# Patient Record
Sex: Male | Born: 1980 | Race: Black or African American | Hispanic: No | Smoking: Former smoker
Health system: Southern US, Community
[De-identification: ages and names within clinical notes are randomized; demographics above are authoritative.]

## PROBLEM LIST (undated history)

## (undated) DIAGNOSIS — D573 Sickle-cell trait: Secondary | ICD-10-CM

---
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
## 2000-01-15 ENCOUNTER — Emergency Department (HOSPITAL_COMMUNITY): Admission: EM | Admit: 2000-01-15 | Discharge: 2000-01-15 | Payer: Self-pay | Admitting: Emergency Medicine

## 2000-04-03 ENCOUNTER — Emergency Department (HOSPITAL_COMMUNITY): Admission: EM | Admit: 2000-04-03 | Discharge: 2000-04-03 | Payer: Self-pay

## 2000-04-03 ENCOUNTER — Encounter: Payer: Self-pay | Admitting: Emergency Medicine

## 2000-06-07 ENCOUNTER — Emergency Department (HOSPITAL_COMMUNITY): Admission: EM | Admit: 2000-06-07 | Discharge: 2000-06-07 | Payer: Self-pay | Admitting: Emergency Medicine

## 2000-06-07 ENCOUNTER — Encounter: Payer: Self-pay | Admitting: Emergency Medicine

## 2000-06-16 ENCOUNTER — Encounter: Payer: Self-pay | Admitting: Emergency Medicine

## 2000-06-16 ENCOUNTER — Emergency Department (HOSPITAL_COMMUNITY): Admission: EM | Admit: 2000-06-16 | Discharge: 2000-06-16 | Payer: Self-pay | Admitting: Emergency Medicine

## 2000-11-09 ENCOUNTER — Emergency Department (HOSPITAL_COMMUNITY): Admission: EM | Admit: 2000-11-09 | Discharge: 2000-11-09 | Payer: Self-pay | Admitting: *Deleted

## 2000-11-10 ENCOUNTER — Emergency Department (HOSPITAL_COMMUNITY): Admission: EM | Admit: 2000-11-10 | Discharge: 2000-11-10 | Payer: Self-pay

## 2000-11-12 ENCOUNTER — Emergency Department (HOSPITAL_COMMUNITY): Admission: EM | Admit: 2000-11-12 | Discharge: 2000-11-12 | Payer: Self-pay | Admitting: Emergency Medicine

## 2001-02-25 ENCOUNTER — Emergency Department (HOSPITAL_COMMUNITY): Admission: EM | Admit: 2001-02-25 | Discharge: 2001-02-25 | Payer: Self-pay | Admitting: Emergency Medicine

## 2001-02-25 ENCOUNTER — Encounter: Payer: Self-pay | Admitting: Emergency Medicine

## 2001-06-14 ENCOUNTER — Encounter: Payer: Self-pay | Admitting: Emergency Medicine

## 2001-06-15 ENCOUNTER — Observation Stay (HOSPITAL_COMMUNITY): Admission: EM | Admit: 2001-06-15 | Discharge: 2001-06-15 | Payer: Self-pay | Admitting: Emergency Medicine

## 2001-06-24 ENCOUNTER — Encounter: Admission: RE | Admit: 2001-06-24 | Discharge: 2001-06-24 | Payer: Self-pay | Admitting: Family Medicine

## 2001-07-14 ENCOUNTER — Emergency Department (HOSPITAL_COMMUNITY): Admission: EM | Admit: 2001-07-14 | Discharge: 2001-07-14 | Payer: Self-pay | Admitting: Emergency Medicine

## 2003-02-12 ENCOUNTER — Emergency Department (HOSPITAL_COMMUNITY): Admission: EM | Admit: 2003-02-12 | Discharge: 2003-02-13 | Payer: Self-pay | Admitting: Emergency Medicine

## 2003-11-10 ENCOUNTER — Emergency Department (HOSPITAL_COMMUNITY): Admission: EM | Admit: 2003-11-10 | Discharge: 2003-11-10 | Payer: Self-pay | Admitting: Emergency Medicine

## 2003-11-13 ENCOUNTER — Emergency Department (HOSPITAL_COMMUNITY): Admission: EM | Admit: 2003-11-13 | Discharge: 2003-11-14 | Payer: Self-pay | Admitting: Emergency Medicine

## 2004-12-03 ENCOUNTER — Emergency Department (HOSPITAL_COMMUNITY): Admission: EM | Admit: 2004-12-03 | Discharge: 2004-12-03 | Payer: Self-pay | Admitting: Emergency Medicine

## 2005-04-10 ENCOUNTER — Emergency Department (HOSPITAL_COMMUNITY): Admission: EM | Admit: 2005-04-10 | Discharge: 2005-04-11 | Payer: Self-pay | Admitting: Emergency Medicine

## 2005-08-16 ENCOUNTER — Emergency Department (HOSPITAL_COMMUNITY): Admission: EM | Admit: 2005-08-16 | Discharge: 2005-08-16 | Payer: Self-pay | Admitting: Emergency Medicine

## 2005-09-29 ENCOUNTER — Emergency Department (HOSPITAL_COMMUNITY): Admission: EM | Admit: 2005-09-29 | Discharge: 2005-09-29 | Payer: Self-pay | Admitting: Emergency Medicine

## 2006-03-09 ENCOUNTER — Emergency Department (HOSPITAL_COMMUNITY): Admission: EM | Admit: 2006-03-09 | Discharge: 2006-03-09 | Payer: Self-pay | Admitting: Emergency Medicine

## 2006-03-17 ENCOUNTER — Emergency Department (HOSPITAL_COMMUNITY): Admission: EM | Admit: 2006-03-17 | Discharge: 2006-03-17 | Payer: Self-pay | Admitting: Emergency Medicine

## 2007-11-16 IMAGING — CR DG FOOT COMPLETE 3+V*R*
3 series · 3 of 3 positions shown · non-contrast
Comparison: none

CLINICAL DATA: Right foot injury with pain.
 RIGHT FOOT ?3  VIEW:
 There is no evidence of fracture or dislocation.  There is no evidence of arthropathy or other focal bone abnormality.  Soft tissues are unremarkable.

[view not recorded (1 of 3)]
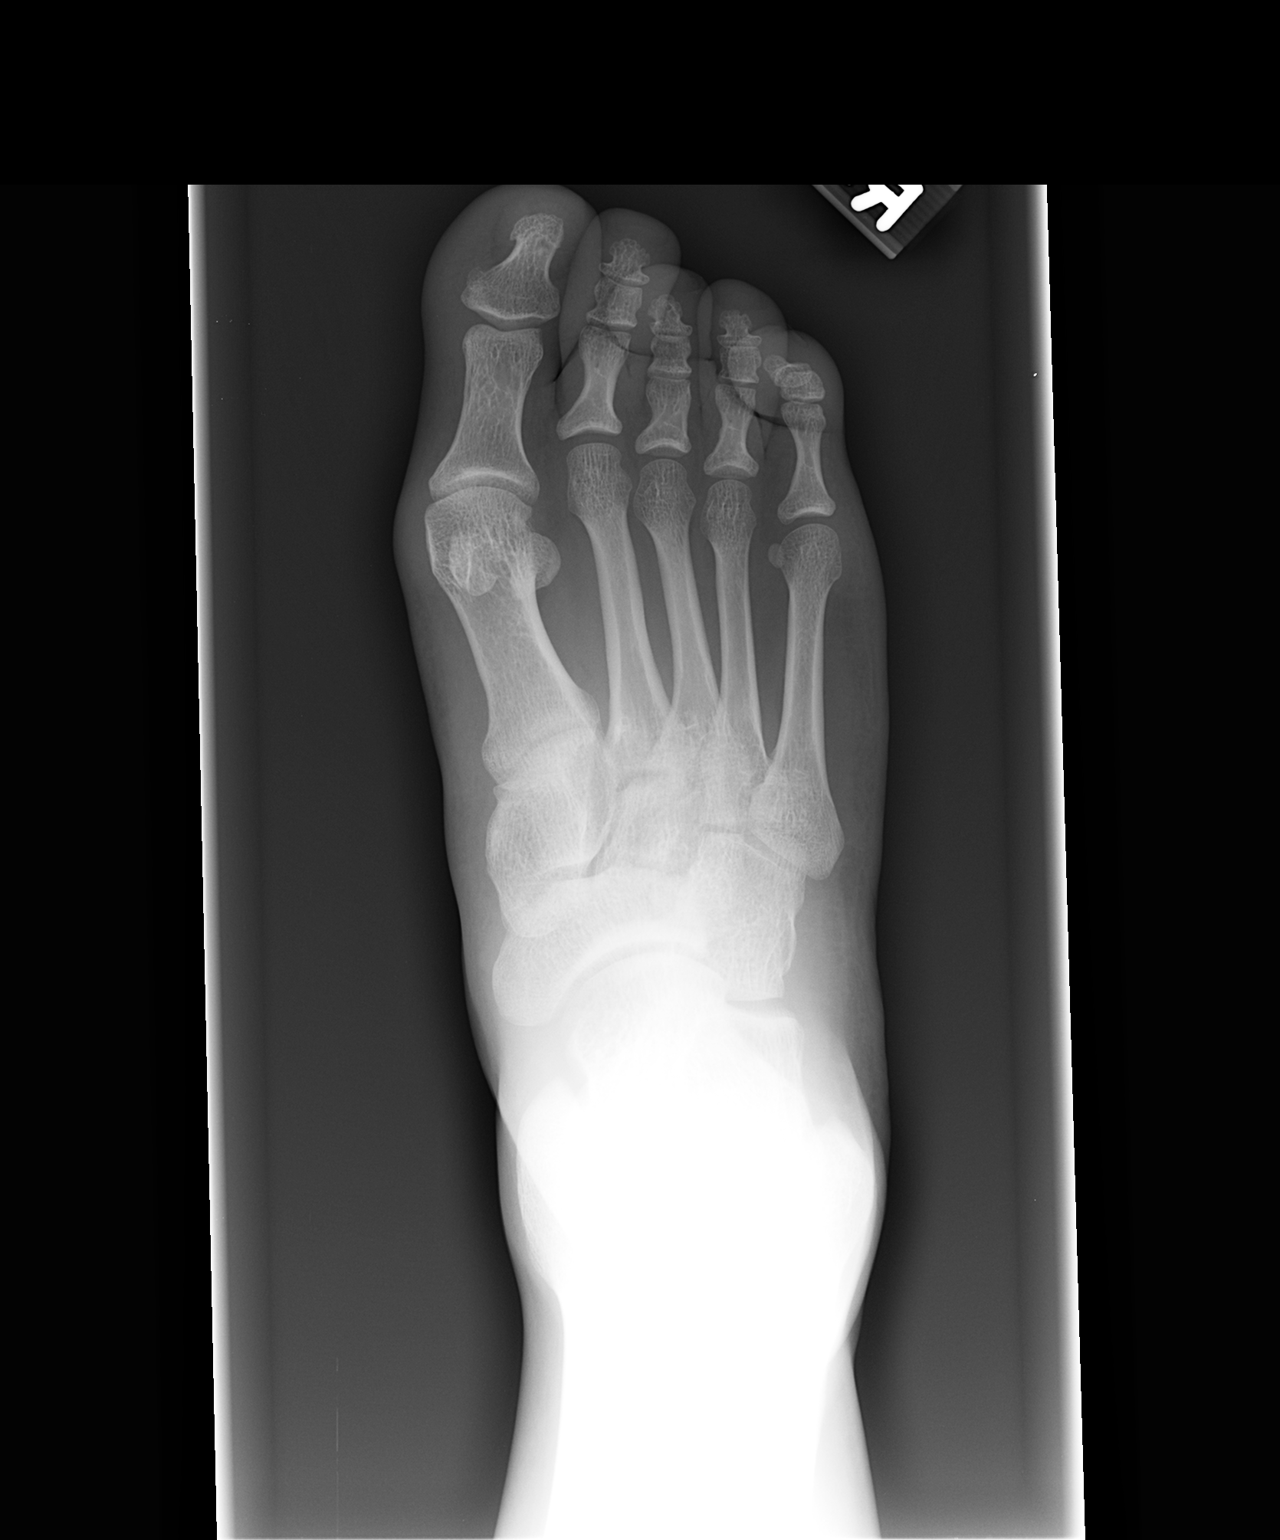

[view not recorded (2 of 3)]
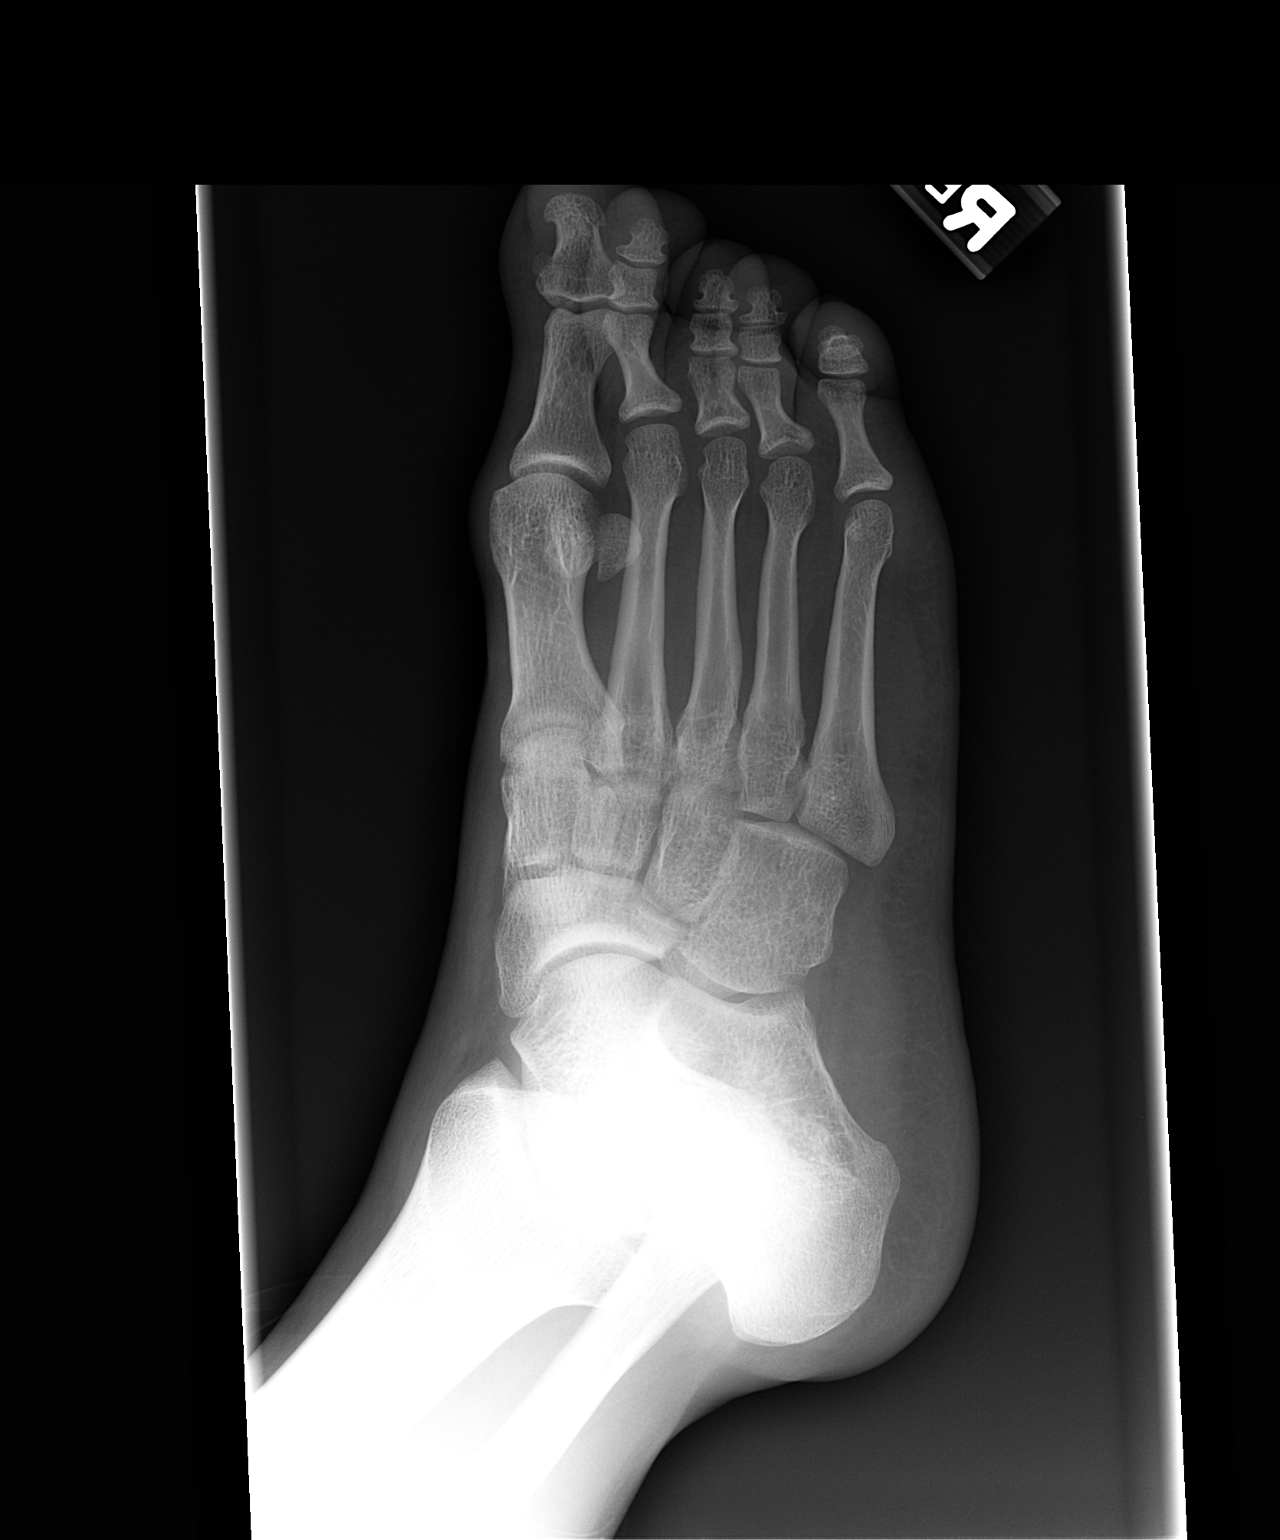

[view not recorded (3 of 3)]
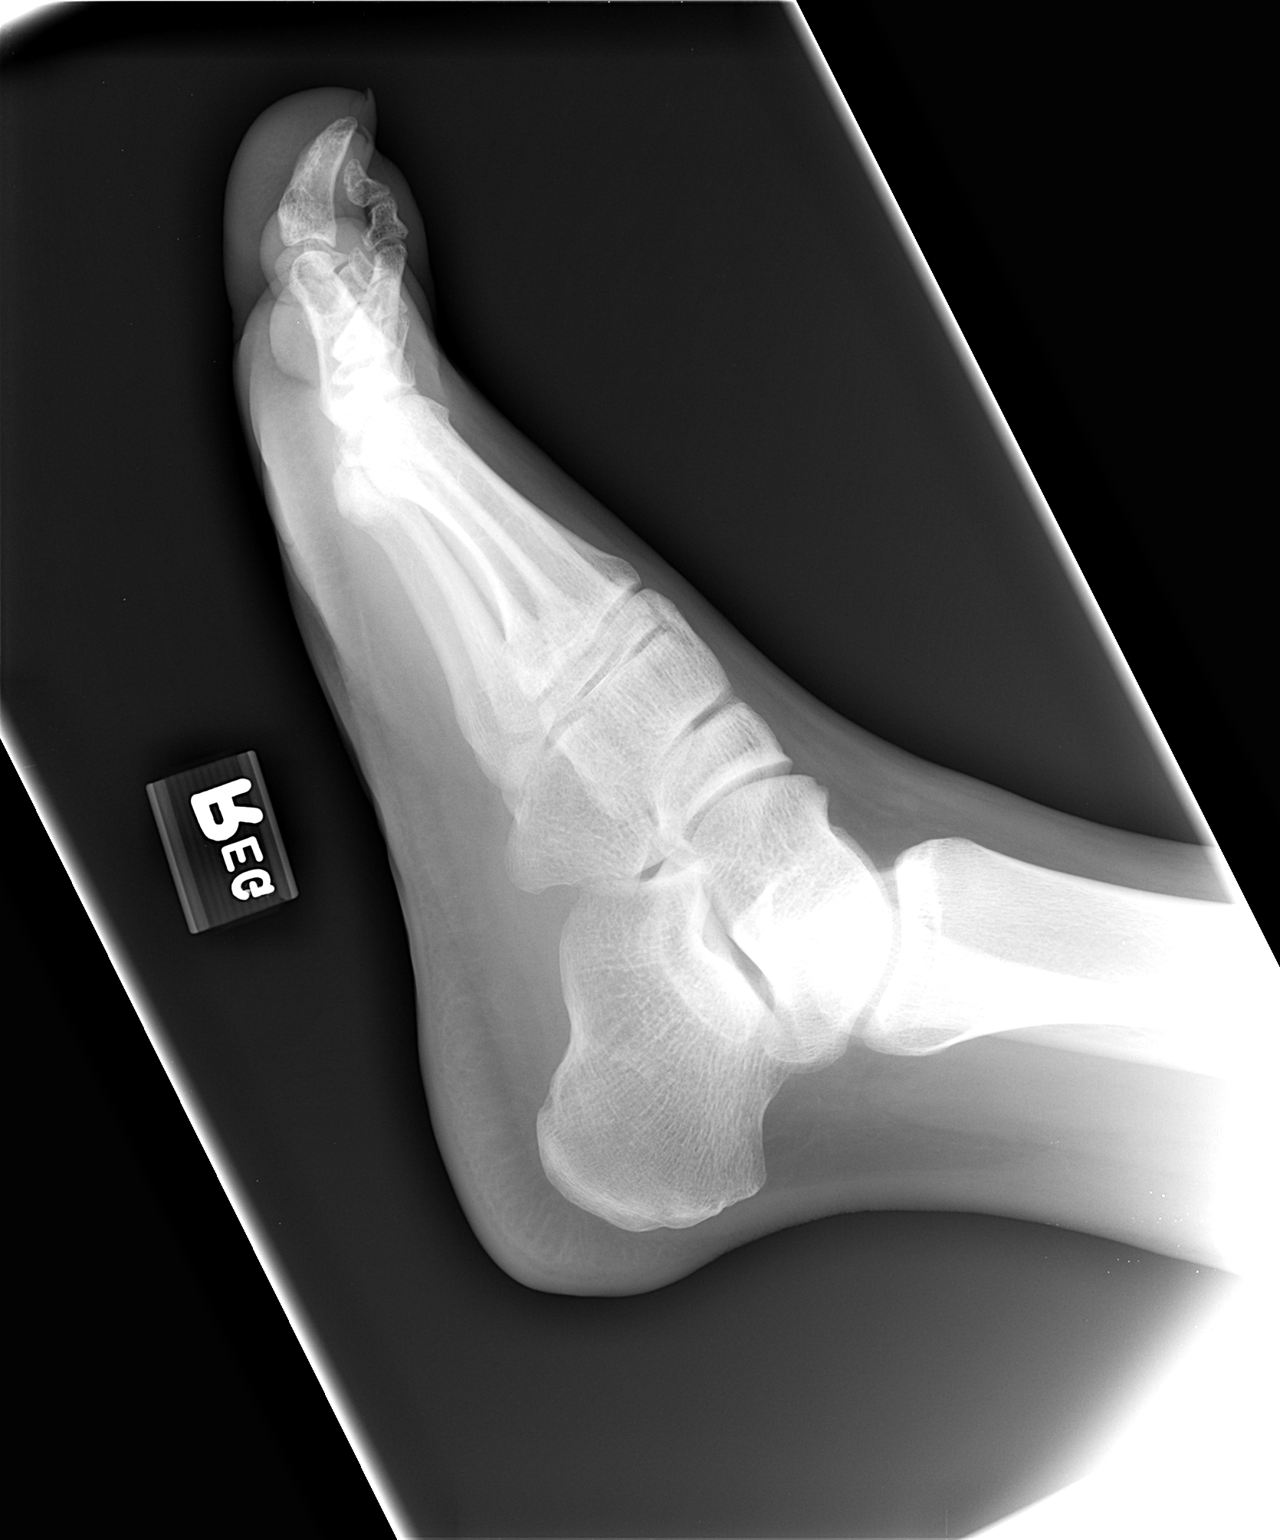

[3 of 3 positions shown; findings below may reference images not displayed]

IMPRESSION: Negative.

## 2007-12-29 ENCOUNTER — Emergency Department (HOSPITAL_COMMUNITY): Admission: EM | Admit: 2007-12-29 | Discharge: 2007-12-29 | Payer: Self-pay | Admitting: Emergency Medicine

## 2008-11-07 ENCOUNTER — Emergency Department (HOSPITAL_COMMUNITY): Admission: EM | Admit: 2008-11-07 | Discharge: 2008-11-07 | Payer: Self-pay | Admitting: Emergency Medicine

## 2009-06-27 ENCOUNTER — Emergency Department (HOSPITAL_COMMUNITY): Admission: EM | Admit: 2009-06-27 | Discharge: 2009-06-27 | Payer: Self-pay | Admitting: Emergency Medicine

## 2009-07-14 ENCOUNTER — Emergency Department (HOSPITAL_COMMUNITY): Admission: EM | Admit: 2009-07-14 | Discharge: 2009-07-14 | Payer: Self-pay | Admitting: Emergency Medicine

## 2009-11-24 ENCOUNTER — Emergency Department (HOSPITAL_COMMUNITY): Admission: EM | Admit: 2009-11-24 | Discharge: 2009-11-24 | Payer: Self-pay | Admitting: Emergency Medicine

## 2010-05-26 NOTE — Discharge Summary (Signed)
[place]  Patient:    FREMONT, SKALICKY Visit Number: [id number] MRN: [id number]          Service Type: MED Location: 3000 [id number] [id number] Attending Physician:  Willow Ora Dictated by:   Arlis Porta, M.D. Admit Date:  06/14/2001 Discharge Date: 06/15/2001                             Discharge Summary  DISCHARGE DIAGNOSES:  Infectious colitis.  LABORATORY DATA: A CMET at discharge showed a sodium of 130, potassium 3.3, chloride 101, c2 28, BUN 10, creatinine 1.1, glucose 120, calcium 8.0. AST 23, ALT 18, alkaline phosphatase 53, total bilirubin 1.0, total protein 5.9, albumin 2.8. A CBC showed a white blood cell count of 5.6, hemoglobin 13.4, hematocrit 39.5, platelets 119, 77% neutrophils, 10% lymphocytes, 10% monocytes, 3% eosinophils with an absolute neutrophil count of 4.3. The ESR was 2. Lipase 16, amylase 52.  PROCEDURES PERFORMED:  A CT scan of the belly showed bowel wall thickening from the splenic flexure to the rectum consistent with infectious colitis versus ulcerative colitis.  HOSPITAL COURSE:  Infectious colitis. The patient is a 30 year old previously healthy African-American male. He was admitted with a two-day history of vomiting and diarrhea. There was some blood associated with his stools. A CT scan findings were above. He was admitted for IV fluid hydration. That same day the patient had improving abdominal pain with decrease of his nausea and vomiting. He was able to tolerate p.o. He remained afebrile with normal and stable vital signs. Irritable bowel disease is less likely given the lack of family history, as well as the lack of previous episodes in the past. The patients ESR of 2 was also reassuring that this was not inflammatory in nature. He will be discharged to home with close followup at the [facility].  DISCHARGE MEDICATIONS 1. Tylenol p.r.n. 2. Phenergan 25 mg p.o. q.6h. p.r.n. nausea.  DISCHARGE  INSTRUCTIONS:  For pain management Tylenol as above. For activity no restrictions. Diet as tolerated. He is to drink lots of fluids. For wound care not applicable. For special instructions he is to call the doctor and come back to the hospital for worsening vomiting, bloody stools or abdominal pain.  FOLLOWUP:  He is to call the [facility] at [phone] to make an apt within the next week.       Dictated by:   Arlis Porta, M.D. Attending Physician:  Willow Ora DD:  06/15/01 TD:  06/17/01 Job: [id number]

## 2010-05-26 NOTE — H&P (Signed)
. [facility]  Patient:    Robert Fitzpatrick, Robert Fitzpatrick Visit Number: [id number] MRN: [id number]          Service Type: MED Location: 3000 3041 01 Attending Physician:  Willow Ora Dictated by:   Emelda Fear, M.D. Admit Date:  06/14/2001 Discharge Date: 06/15/2001                           History and Physical  DATE OF BIRTH:  22-May-1980  CHIEF COMPLAINT: 1. Vomiting. 2. Bloody diarrhea.  HISTORY OF PRESENT ILLNESS:  The patient is a 30 year old African American male presenting secondary to a two-day history of vomiting and diarrhea, onset of vomiting approximately two days prior to presentation.  Vomiting is nonbloody, nonbilious and occurs approximately two times per day.  The patient reports a two-day history of bloody stools without mucus.  The patient reports frequency of stools approximately 30 minutes to every hour for the past 2 days.  No fevers, yet subjective chills.  Positive headaches for the past two days with symptoms.  No previous history of bloody stools.  Positive sick contact, the patients daughter with diarrhea, nonbloody, last week. Decreased appetite with decreased p.o. intake for the past two days.  Positive mild back pain with symptoms.  No dysuria.  No joint pains, rash, oral lesions, or conjunctivitis.  No history of ulcerative colitis or Crohns disease in family.  No ingestion of unique or spoiled foods.  No sore throat, ear pain, or URI symptoms.  PAST MEDICAL HISTORY:  No hospitalizations.  No surgeries.  Presented one year ago to the emergency room secondary to dysuria and penile discharge and treated with antibiotics.  MEDICATIONS: 1. Pepto-Bismol p.r.n. 2. Kaopectate p.r.n.  ALLERGIES:  No known drug allergies.  SOCIAL HISTORY:  The patient is single and lives in [place] with mother, stepfather, and sister; patient currently getting his GED and is unemployed. Positive tobacco use, one pack per week.   No alcohol use.  Positive history of marijuana use.  No history of cocaine or illicit drugs and the patient currently sexually active; denies homosexual encounters.  The patient has two children who do not live with him currently.  FAMILY HISTORY:  Negative for ulcerative colitis or Crohns disease.  REVIEW OF SYSTEMS:  No weight changes.  No fatigue or malaise.  No chest pain or chest palpitations.  No shortness of breath or wheezing.  No history of hepatitis.  No dysuria.  No joint problems.  No skin abnormalities.  No history of bloody stools.  PHYSICAL EXAMINATION:  VITALS:  Temperature is 97.7.  Pulse is 97.  Respirations are 20.  Blood pressure is 124/71 and O2 saturation is 98% on room air.  GENERAL:  Well-developed, well-nourished African American in no acute distress, soft-spoken male, very polite and interactive.  HEENT:  Pupils are equal, round and reactive to light.  Sclerae are mildly icteric.  Nares are patent without discharge.  Mucous membranes are slightly dry.  Oropharynx is without erythema or ulcerations.  Tympanic membranes are within normal limits.  NECK:  Supple, full range of motion, positive shotty posterior cervical lymphadenopathy.  CARDIOVASCULAR:  Regular rate and rhythm.  No murmurs.  LUNGS:  Clear to auscultation bilaterally.  ABDOMEN:  Thin, soft, nondistended.  Positive bowel sounds, normoactive. Positive tenderness diffusely.  No rebound.  No guarding.  No hepatosplenomegaly or masses appreciated.  EXTREMITIES:  No clubbing, cyanosis, or edema.  Dorsalis  pedis and radial pulses 2+.  NEUROLOGIC:  Cranial nerves II-XII are intact.  Motor is 5/5 x4 extremities and sensation is intact throughout.  RECTAL:  Normal sphincter tone.  Positive tenderness with exam. Heme-positive.  No gross blood.  SKIN:  No rash; warm and dry.  LABORATORY AND ACCESSORY DATA:  Sodium is 132, potassium 3.3, chloride 96, CO2 26, BUN 13, creatinine 1.4, glucose 101,  total protein 7.6, albumin 3.8, AST 25; ALT 19, alkaline phosphatase 67, total bilirubin 2.0, indirect bilirubin 1.8, direct bilirubin 0.2.  White blood cells 7.2, hemoglobin 15.7, hematocrit 46.2, platelets 129,000; neutrophils 84%, lymphocytes 7%, ANC is 6.0, ALC is 0.5.  Amylase of 52, lipase 16.  PTT 31, PT 15.3, INR 1.2.  CT of abdomen:  Wall thickening appreciated at the splenic flexure to the rectum consistent with infectious colitis versus ulcerative colitis.  ASSESSMENT AND PLAN:  Th[age]-year-old African American male presenting with a two-day history of vomiting and bloody stools, diagnosed with colitis via CT scan.  1. Gastrointestinal:  Positive colitis appreciated on CT of abdomen.    Differential diagnosis includes viral versus bacterial gastroenteritis    versus ulcerative colitis.  Will make the patient n.p.o. overnight and    advance to clears in a.m.  No family history of ulcerative colitis or    Crohns.  We will check a stool culture and sedimentation rate on todays    visit.  We will observe the patient overnight.  The patient would benefit    from following up on sedimentation rate as well as an outpatient    colonoscopy. 2. Infectious disease:  The patient currently afebrile with a normal white    count.  We will check stool cultures and stool white blood cells.  We will    monitor temperature curve.  No antibiotics indicated at this time. 3. Hematology:  Hemoglobin and hematocrit stable at this time.  We will follow    up on complete blood count in the morning after intravenous hydration.    Decreased platelets with borderline coagulation studies; we will follow up    on studies in the morning. 4. Cardiovascular:  Patient currently hemodynamically stable with a normal    blood pressure as well as pulse. 5. Fluids, electrolytes, and nutrition:  We will make the patient n.p.o.    overnight and advance diet in the morning.  Provide intravenous hydration     overnight.  Follow up on electrolytes in the morning, however, electrolytes    normal at this time.  6. Disposition:  Anticipate short hospitalization. Dictated by:   Emelda Fear, M.D. Attending Physician:  Willow Ora DD:  06/15/01 TD:  06/17/01 Job: [id number]

## 2010-06-24 ENCOUNTER — Emergency Department (HOSPITAL_COMMUNITY)
Admission: EM | Admit: 2010-06-24 | Discharge: 2010-06-24 | Disposition: A | Discharge status: Home / Self Care | Payer: Self-pay | Attending: Emergency Medicine | Admitting: Emergency Medicine

## 2010-06-24 DIAGNOSIS — B356 Tinea cruris: Secondary | ICD-10-CM | POA: Insufficient documentation

## 2010-06-24 DIAGNOSIS — L259 Unspecified contact dermatitis, unspecified cause: Secondary | ICD-10-CM | POA: Insufficient documentation

## 2010-06-24 DIAGNOSIS — L298 Other pruritus: Secondary | ICD-10-CM | POA: Insufficient documentation

## 2010-06-24 DIAGNOSIS — L2989 Other pruritus: Secondary | ICD-10-CM | POA: Insufficient documentation

## 2010-07-02 ENCOUNTER — Emergency Department (HOSPITAL_COMMUNITY)
Admission: EM | Admit: 2010-07-02 | Discharge: 2010-07-02 | Disposition: A | Discharge status: Home / Self Care | Payer: Self-pay | Attending: Emergency Medicine | Admitting: Emergency Medicine

## 2010-07-02 DIAGNOSIS — B86 Scabies: Secondary | ICD-10-CM | POA: Insufficient documentation

## 2010-07-14 ENCOUNTER — Emergency Department (HOSPITAL_COMMUNITY)
Admission: EM | Admit: 2010-07-14 | Discharge: 2010-07-14 | Disposition: A | Discharge status: Home / Self Care | Payer: Self-pay | Attending: Emergency Medicine | Admitting: Emergency Medicine

## 2010-07-14 DIAGNOSIS — Z87891 Personal history of nicotine dependence: Secondary | ICD-10-CM | POA: Insufficient documentation

## 2010-07-14 DIAGNOSIS — L259 Unspecified contact dermatitis, unspecified cause: Secondary | ICD-10-CM | POA: Insufficient documentation

## 2011-03-28 ENCOUNTER — Encounter (HOSPITAL_COMMUNITY): Payer: Self-pay | Admitting: *Deleted

## 2011-03-28 ENCOUNTER — Emergency Department (HOSPITAL_COMMUNITY)
Admission: EM | Admit: 2011-03-28 | Discharge: 2011-03-28 | Discharge status: Against Medical Advice | Payer: Self-pay | Attending: Emergency Medicine | Admitting: Emergency Medicine

## 2011-03-28 DIAGNOSIS — F22 Delusional disorders: Secondary | ICD-10-CM | POA: Insufficient documentation

## 2011-03-28 NOTE — ED Notes (Signed)
Spoke with Lemont Fillers stated was going to give resources patient left without paper work.

## 2011-03-28 NOTE — ED Notes (Signed)
Pt states he has a long list of complaints and needs to see the doctor immediately. Complains of an itch over entire body. States he is very depressed and gets really anxious and violent when he is alone.

## 2011-03-28 NOTE — ED Notes (Signed)
Pt states that he stopped drinking and smoking, and whenever he was around someone who is smoking he starts to itch. Pt states that if he drinks his body rejects it. Pt is unable to give me any real complaint. States he has an entire list of things he would like to talk to the doctor about. Pt states he went to other places and was not taken serious.

## 2011-03-28 NOTE — ED Provider Notes (Signed)
History     CSN: [id number]  Arrival date & time 03/28/11  1220   First MD Initiated Contact with Patient 03/28/11 1359      Chief Complaint  Patient presents with  . Follow-up    (Consider location/radiation/quality/duration/timing/severity/associated sxs/prior treatment) The history is provided by the patient.  Pt is a 31yo male, presents with multiple complaints. Pt has a large piece of paper with him, front to back full of complaints that he states " I am going to read everything that is going on with me. "  Most of the complaints on the sheet are various rashes that he gets on and off for the last 4 months. State sometimes gets itching while int he shower, sometimes gets itching when around people who smoke. States gets rash and headache, as well as "funny sensation" when drinking alcohol. States he feels like there is something crawling under his skin, it moves around his body, from head, to arms, to abdomen, to legs and toes. Pt states he has been seen for this by his PCP and other ERs, states "no one takes me seriously, I am not crazy." Pt states he has done a lot of research on "google" and thinks he has candida and states "I need a very strong antibiotic, that is the only thing that will get rid of this."  Pt denies fever, chills, head or neck pain, denies hallucinations, denies SI or HI.  History reviewed. No pertinent past medical history.  History reviewed. No pertinent past surgical history.  History reviewed. No pertinent family history.  History  Substance Use Topics  . Smoking status: Former Games developer  . Smokeless tobacco: Not on file  . Alcohol Use: No      Review of Systems  Constitutional: Negative for fever, chills and fatigue.  HENT: Negative for neck pain.   Eyes: Negative.   Respiratory: Negative.   Cardiovascular: Negative.   Gastrointestinal: Negative.   Genitourinary: Negative.   Musculoskeletal: Negative.   Skin: Positive for rash.  Neurological:  Negative.   Psychiatric/Behavioral: Negative.     Allergies  Review of patient's allergies indicates no known allergies.  Home Medications  No current outpatient prescriptions on file.  BP 122/83  Pulse 62  Temp(Src) 98.4 F (36.9 C) (Oral)  Resp 16  SpO2 96%  Physical Exam  Nursing note and vitals reviewed. Constitutional: He is oriented to person, place, and time. He appears well-developed and well-nourished. No distress.  HENT:  Head: Normocephalic and atraumatic.  Right Ear: External ear normal.  Left Ear: External ear normal.  Nose: Nose normal.  Mouth/Throat: Oropharynx is clear and moist.       TMs normal bilaterally. There is carries int he left upper 1st molar tooth, no signs of an abscess  Eyes: Conjunctivae are normal.  Neck: Neck supple.  Cardiovascular: Normal rate, regular rhythm and normal heart sounds.   Pulmonary/Chest: Effort normal and breath sounds normal. No respiratory distress.  Abdominal: Soft. Bowel sounds are normal. There is no tenderness.  Musculoskeletal: Normal range of motion.  Neurological: He is alert and oriented to person, place, and time.  Skin: Skin is warm and dry.       No rash on entire body  Psychiatric:       Pt appears paranoid, anxious. Speech normal.     ED Course  Procedures (including critical care time)  I do not see any evidence of any abnormalities on physical exam. I do not see any rash. Pt appears  very paranoid, anxious. I suspect some delusional aspects. I believe this pt is experiencing some psychosis vs schizophrenia. He is however alert and oriented x3, and does not appear to be in danger of self harm or harm to anyone else. I went to speak to [facility] for any recommendations. They do not believe he is a candidate for inpatient therapy. Pt also states he does not believe this is psychiatric problem and refusing to see a psychiatrist. Pt actually walked out of ED without informing staff.  No diagnosis found.    MDM           Lottie Mussel, PA 03/28/11 1520

## 2011-03-28 NOTE — ED Notes (Signed)
Pt. Could not give a specific complaint to why he was here. Stated that he just needs to see a dr.

## 2011-03-28 NOTE — ED Notes (Signed)
Patient not in room looked in ED waiting room and ED.

## 2011-03-29 NOTE — ED Provider Notes (Signed)
Medical screening examination/treatment/procedure(s) were performed by non-physician practitioner and as supervising physician I was immediately available for consultation/collaboration.   Dione Booze, MD 03/29/11 [phone]

## 2011-04-02 ENCOUNTER — Emergency Department (HOSPITAL_COMMUNITY)
Admission: EM | Admit: 2011-04-02 | Discharge: 2011-04-02 | Disposition: A | Discharge status: Home / Self Care | Payer: Self-pay | Attending: Emergency Medicine | Admitting: Emergency Medicine

## 2011-04-02 ENCOUNTER — Encounter (HOSPITAL_COMMUNITY): Payer: Self-pay | Admitting: Emergency Medicine

## 2011-04-02 DIAGNOSIS — Z711 Person with feared health complaint in whom no diagnosis is made: Secondary | ICD-10-CM

## 2011-04-02 DIAGNOSIS — K0381 Cracked tooth: Secondary | ICD-10-CM | POA: Insufficient documentation

## 2011-04-02 DIAGNOSIS — Z87891 Personal history of nicotine dependence: Secondary | ICD-10-CM | POA: Insufficient documentation

## 2011-04-02 DIAGNOSIS — K0889 Other specified disorders of teeth and supporting structures: Secondary | ICD-10-CM

## 2011-04-02 MED ORDER — CEFTRIAXONE SODIUM 250 MG IJ SOLR
250.0000 mg | Freq: Once | INTRAMUSCULAR | Status: AC
  Administered 2011-04-02: 250 mg via INTRAMUSCULAR
  Filled 2011-04-02: qty 250

## 2011-04-02 MED ORDER — AZITHROMYCIN 250 MG PO TABS
1000.0000 mg | ORAL_TABLET | Freq: Once | ORAL | Status: AC
  Administered 2011-04-02: 1000 mg via ORAL
  Filled 2011-04-02: qty 4

## 2011-04-02 MED ORDER — OXYCODONE-ACETAMINOPHEN 5-325 MG PO TABS: 2.0000 | ORAL_TABLET | ORAL | Status: AC | PRN

## 2011-04-02 NOTE — Progress Notes (Signed)
Pt listed as self pay with no insurance coverage Pt confirms he is self pay [place] resident CM and [facility] community liaison spoke with him Pt offered [facility] services to assist with finding a [place] self pay provider Pt accepted information  Pt provided instructions on how to update his orange card that expired in February 2013 He stated he had been attempting to get card updated but unsuccessful

## 2011-04-02 NOTE — ED Provider Notes (Signed)
History     CSN: [id number]  Arrival date & time 04/02/11  1751   First MD Initiated Contact with Patient 04/02/11 1802      Chief Complaint  Patient presents with  . Dental Pain    (Consider location/radiation/quality/duration/timing/severity/associated sxs/prior treatment) HPI  Patient presents to the emergency department with complaints of tooth pain. The patient states that he had oral sex 3 weeks ago with a new partner and inflated a bunch of problems. His problems include his tooth hurting and what he says are lesions in his mouth. He denies having fevers, chills, weakness, penile discharge, rash, lesions. The patient has not been seen yet for this problem and has not asked his partner whether she has been diagnosed with STD.  History reviewed. No pertinent past medical history.  History reviewed. No pertinent past surgical history.  No family history on file.  History  Substance Use Topics  . Smoking status: Former Games developer  . Smokeless tobacco: Not on file  . Alcohol Use: No      Review of Systems  All other systems reviewed and are negative.    Allergies  Review of patient's allergies indicates no known allergies.  Home Medications   Current Outpatient Rx  Name Route Sig Dispense Refill  . HYDROCORTISONE 1 % EX OINT Topical Apply 1 application topically 2 (two) times daily. On rash of right armpit    . OXYCODONE-ACETAMINOPHEN 5-325 MG PO TABS Oral Take 2 tablets by mouth every 4 (four) hours as needed for pain. 6 tablet 0    BP 131/70  Pulse 76  Temp(Src) 98.3 F (36.8 C) (Oral)  Resp 16  Ht 5\' 4"  (1.626 m)  Wt 170 lb (77.111 kg)  BMI 29.18 kg/m2  SpO2 98%  Physical Exam  Nursing note and vitals reviewed. Constitutional: He appears well-developed and well-nourished. No distress.  HENT:  Head: Normocephalic and atraumatic. No trismus in the jaw.  Mouth/Throat: Uvula is midline, oropharynx is clear and moist and mucous membranes are normal. He  does not have dentures. No oral lesions. Normal dentition. Dental caries present. No dental abscesses, uvula swelling or lacerations.         pts lesions are subjective as I do not see any lesions in his mouth or any abnormalities aside from a chipped upper left molar.  Eyes: Pupils are equal, round, and reactive to light.  Neck: Normal range of motion. Neck supple.  Cardiovascular: Normal rate and regular rhythm.   Pulmonary/Chest: Effort normal and breath sounds normal.  Neurological: He is alert.  Skin: Skin is warm and dry.    ED Course  Procedures (including critical care time)  Labs Reviewed - No data to display No results found.   1. Concern about STD in male without diagnosis   2. Toothache       MDM  Pt treated for STD's in ER with Rocephin and Azothromycin. Given Rx for Percoets (6 Tabs) for dental pain. Pt given referral to  Dr. Leanord Asal to have tooth extracted.  Pt has been advised of the symptoms that warrant their return to the ED. Patient has voiced understanding and has agreed to follow-up with the PCP or specialist.         Dorthula Matas, PA 04/02/11 1910

## 2011-04-02 NOTE — ED Notes (Signed)
Pt reports dental pain for about a year. Pt states that he has been seen by a dentist and has had one tooth pulled and needs to have another tooth pulled. Pt states, "I recently had oral sex with this girl about 3 weeks ago and it made it a lot worse."

## 2011-04-02 NOTE — Discharge Instructions (Signed)
Dental Pain  A tooth ache may be caused by cavities (tooth decay). Cavities expose the nerve of the tooth to air and hot or cold temperatures. It may come from an infection or abscess (also called a boil or furuncle) around your tooth. It is also often caused by dental caries (tooth decay). This causes the pain you are having.  DIAGNOSIS   Your caregiver can diagnose this problem by exam.  TREATMENT   · If caused by an infection, it may be treated with medications which kill germs (antibiotics) and pain medications as prescribed by your caregiver. Take medications as directed.  · Only take over-the-counter or prescription medicines for pain, discomfort, or fever as directed by your caregiver.  · Whether the tooth ache today is caused by infection or dental disease, you should see your dentist as soon as possible for further care.  SEEK MEDICAL CARE IF:  The exam and treatment you received today has been provided on an emergency basis only. This is not a substitute for complete medical or dental care. If your problem worsens or new problems (symptoms) appear, and you are unable to meet with your dentist, call or return to this location.  SEEK IMMEDIATE MEDICAL CARE IF:   · You have a fever.  · You develop redness and swelling of your face, jaw, or neck.  · You are unable to open your mouth.  · You have severe pain uncontrolled by pain medicine.  MAKE SURE YOU:   · Understand these instructions.  · Will watch your condition.  · Will get help right away if you are not doing well or get worse.  Document Released: 12/25/2004 Document Revised: 12/14/2010 Document Reviewed: 08/13/2007  Exit[employer]® Patient Information ©2012 [employer].

## 2011-04-03 NOTE — ED Provider Notes (Signed)
Medical screening examination/treatment/procedure(s) were performed by non-physician practitioner and as supervising physician I was immediately available for consultation/collaboration.    Daisee Centner R Bianney Rockwood, MD 04/03/11 0046 

## 2011-04-24 ENCOUNTER — Emergency Department (HOSPITAL_COMMUNITY)
Admission: EM | Admit: 2011-04-24 | Discharge: 2011-04-24 | Disposition: A | Discharge status: Home / Self Care | Payer: Self-pay | Attending: Emergency Medicine | Admitting: Emergency Medicine

## 2011-04-24 ENCOUNTER — Encounter (HOSPITAL_COMMUNITY): Payer: Self-pay | Admitting: *Deleted

## 2011-04-24 ENCOUNTER — Emergency Department (HOSPITAL_COMMUNITY): Payer: Self-pay

## 2011-04-24 DIAGNOSIS — R109 Unspecified abdominal pain: Secondary | ICD-10-CM | POA: Insufficient documentation

## 2011-04-24 DIAGNOSIS — K5900 Constipation, unspecified: Secondary | ICD-10-CM | POA: Insufficient documentation

## 2011-04-24 LAB — URINALYSIS, ROUTINE W REFLEX MICROSCOPIC
Glucose, UA: NEGATIVE mg/dL
Ketones, ur: 15 mg/dL — AB
Leukocytes, UA: NEGATIVE
Nitrite: NEGATIVE
Urobilinogen, UA: 0.2 mg/dL (ref 0.0–1.0)

## 2011-04-24 LAB — GLUCOSE, CAPILLARY: Glucose-Capillary: 87 mg/dL (ref 70–99)

## 2011-04-24 NOTE — ED Notes (Signed)
MD at bedside. 

## 2011-04-24 NOTE — Discharge Instructions (Signed)
Use miralax 1 dose (17 grams) in 8 ounces of water and repeat every 30 minutes for 2-3 hours or until you get good results, then take once daily to prevent constipation. Recheck if you get abdominal pain, vomiting, fever or seem worse.     Constipation in Adults Constipation is having fewer than 2 bowel movements per week. Usually, the stools are hard. As we grow older, constipation is more common. If you try to fix constipation with laxatives, the problem may get worse. This is because laxatives taken over a long period of time make the colon muscles weaker. A low-fiber diet, not taking in enough fluids, and taking some medicines may make these problems worse. MEDICATIONS THAT MAY CAUSE CONSTIPATION  Water pills (diuretics).   Calcium channel blockers (used to control blood pressure and for the heart).   Certain pain medicines (narcotics).   Anticholinergics.   Anti-inflammatory agents.   Antacids that contain aluminum HOME CARE INSTRUCTIONS   Constipation is usually best cared for without medicines. Increasing dietary fiber and eating more fruits and vegetables is the best way to manage constipation.   Slowly increase fiber intake to 25 to 38 grams per day. Whole grains, fruits, vegetables, and legumes are good sources of fiber. A dietitian can further help you incorporate high-fiber foods into your diet.   Drink enough water and fluids to keep your urine clear or pale yellow.   A fiber supplement may be added to your diet if you cannot get enough fiber from foods.   Increasing your activities also helps improve regularity.   Suppositories, as suggested by your caregiver, will also help. If you are using antacids, such as aluminum or calcium containing products, it will be helpful to switch to products containing magnesium if your caregiver says it is okay.   If you have been given a liquid injection (enema) today, this is only a temporary measure. It should not be relied on for  treatment of longstanding (chronic) constipation.   Stronger measures, such as magnesium sulfate, should be avoided if possible. This may cause uncontrollable diarrhea. Using magnesium sulfate may not allow you time to make it to the bathroom.

## 2011-04-24 NOTE — ED Provider Notes (Signed)
History     CSN: [id number]  Arrival date & time 04/24/11  1447   First MD Initiated Contact with Patient 04/24/11 1500      Chief Complaint  Patient presents with  . Abdominal Cramping    (Consider location/radiation/quality/duration/timing/severity/associated sxs/prior treatment) HPI  Patient  relates for the past few months he's been having nausea off and on and also with diarrhea alternating with constipation. He states he's having a bowel movement about every 3 days. States when he goes up a small amount and he has to strain and takes about a half hour. He states sometimes his passing balls. He states his abdomen feels bloated but he denies any fevers. He denies any change in his appetite. He states thatsometimes he sees some red.  PCP none  History reviewed. No pertinent past medical history.  History reviewed. No pertinent past surgical history.  No family history on file. No GI problems  History  Substance Use Topics  . Smoking status: Former Smoker  Quit 1-2 years ago  . Smokeless tobacco: Not on file  . Alcohol Use: No   Quit 1-2 years ago  unemployed    Review of Systems  All other systems reviewed and are negative.    Allergies  Review of patient's allergies indicates no known allergies.  Home Medications   Current Outpatient Rx  Name Route Sig Dispense Refill  . HYDROCORTISONE 1 % EX OINT Topical Apply 1 application topically 2 (two) times daily. On rash of right armpit      BP 128/79  Pulse 61  Temp(Src) 98.7 F (37.1 C) (Oral)  Resp 16  SpO2 98%  Vital signs normal    Physical Exam  Nursing note and vitals reviewed. Constitutional: He is oriented to person, place, and time. He appears well-developed and well-nourished.  Non-toxic appearance. He does not appear ill. No distress.  HENT:  Head: Normocephalic and atraumatic.  Right Ear: External ear normal.  Left Ear: External ear normal.  Nose: Nose normal. No mucosal edema or  rhinorrhea.  Mouth/Throat: Oropharynx is clear and moist and mucous membranes are normal. No dental abscesses or uvula swelling.  Eyes: Conjunctivae and EOM are normal. Pupils are equal, round, and reactive to light.  Neck: Normal range of motion and full passive range of motion without pain. Neck supple.  Cardiovascular: Normal rate, regular rhythm and normal heart sounds.  Exam reveals no gallop and no friction rub.   No murmur heard. Pulmonary/Chest: Effort normal and breath sounds normal. No respiratory distress. He has no wheezes. He has no rhonchi. He has no rales. He exhibits no tenderness and no crepitus.  Abdominal: Soft. Normal appearance and bowel sounds are normal. He exhibits no distension. There is no tenderness. There is no rebound and no guarding.  Genitourinary:       No external hemorrhoids, no stool in vault. Hemoccult neg,  Musculoskeletal: Normal range of motion. He exhibits no edema and no tenderness.       Moves all extremities well.   Neurological: He is alert and oriented to person, place, and time. He has normal strength. No cranial nerve deficit.  Skin: Skin is warm, dry and intact. No rash noted. No erythema. No pallor.  Psychiatric: He has a normal mood and affect. His speech is normal and behavior is normal. His mood appears not anxious.    ED Course  Procedures (including critical care time)  Results for orders placed during the hospital encounter of 04/24/11  OCCULT BLOOD,  POC DEVICE      Component Value Range   Fecal Occult Bld NEGATIVE    URINALYSIS, ROUTINE W REFLEX MICROSCOPIC      Component Value Range   Color, Urine YELLOW  YELLOW    APPearance CLEAR  CLEAR    Specific Gravity, Urine 1.020  1.005 - 1.030    pH 6.0  5.0 - 8.0    Glucose, UA NEGATIVE  NEGATIVE (mg/dL)   Hgb urine dipstick NEGATIVE  NEGATIVE    Bilirubin Urine NEGATIVE  NEGATIVE    Ketones, ur 15 (*) NEGATIVE (mg/dL)   Protein, ur NEGATIVE  NEGATIVE (mg/dL)   Urobilinogen, UA 0.2   0.0 - 1.0 (mg/dL)   Nitrite NEGATIVE  NEGATIVE    Leukocytes, UA NEGATIVE  NEGATIVE   GLUCOSE, CAPILLARY      Component Value Range   Glucose-Capillary 87  70 - 99 (mg/dL)   Comment 1 Documented in Chart     Comment 2 Notify RN     Dg Abd Acute W/chest  04/24/2011  *RADIOLOGY REPORT*  Clinical Data: Abdominal pain, nausea, constipation.  ACUTE ABDOMEN SERIES (ABDOMEN 2 VIEW & CHEST 1 VIEW)  Comparison: None.  Findings: Large stool burden throughout the colon. The bowel gas pattern is normal.  There is no evidence of free intraperitoneal air.  No suspicious radio-opaque calculi or other significant radiographic abnormality is seen. Heart size and mediastinal contours are within normal limits.  Both lungs are clear.  IMPRESSION: No acute findings.  Large stool burden.  Original Report Authenticated By: Cyndie Chime, M.D.      1. Constipation    Plan discharge Devoria Albe, MD, FACEP    MDM          Ward Givens, MD 04/24/11 [phone]

## 2011-04-24 NOTE — ED Notes (Signed)
Pt states he has been having abdominal discomfort for months. Pt states he is having bowel irregularity for months. Pt denies any vomiting pt states now he feels like he has to force his self to use the bathroom

## 2011-05-19 ENCOUNTER — Encounter (HOSPITAL_COMMUNITY): Payer: Self-pay | Admitting: *Deleted

## 2011-05-19 ENCOUNTER — Emergency Department (HOSPITAL_COMMUNITY)
Admission: EM | Admit: 2011-05-19 | Discharge: 2011-05-19 | Disposition: A | Discharge status: Home / Self Care | Payer: Self-pay | Attending: Emergency Medicine | Admitting: Emergency Medicine

## 2011-05-19 DIAGNOSIS — F411 Generalized anxiety disorder: Secondary | ICD-10-CM | POA: Insufficient documentation

## 2011-05-19 DIAGNOSIS — D573 Sickle-cell trait: Secondary | ICD-10-CM | POA: Insufficient documentation

## 2011-05-19 DIAGNOSIS — R0981 Nasal congestion: Secondary | ICD-10-CM

## 2011-05-19 DIAGNOSIS — M2550 Pain in unspecified joint: Secondary | ICD-10-CM | POA: Insufficient documentation

## 2011-05-19 DIAGNOSIS — F419 Anxiety disorder, unspecified: Secondary | ICD-10-CM

## 2011-05-19 DIAGNOSIS — J3489 Other specified disorders of nose and nasal sinuses: Secondary | ICD-10-CM | POA: Insufficient documentation

## 2011-05-19 HISTORY — DX: Sickle-cell trait: D57.3

## 2011-05-19 LAB — COMPREHENSIVE METABOLIC PANEL
ALT: 22 U/L (ref 0–53)
AST: 33 U/L (ref 0–37)
Albumin: 4.1 g/dL (ref 3.5–5.2)
Alkaline Phosphatase: 59 U/L (ref 39–117)
BUN: 14 mg/dL (ref 6–23)
CO2: 24 mEq/L (ref 19–32)
Calcium: 9.1 mg/dL (ref 8.4–10.5)
Chloride: 100 mEq/L (ref 96–112)
Creatinine, Ser: 1.01 mg/dL (ref 0.50–1.35)
GFR calc Af Amer: >90 mL/min (ref >90)
GFR calc non Af Amer: >90 mL/min (ref >90)
Glucose, Bld: 92 mg/dL (ref 70–99)
Potassium: 3.7 mEq/L (ref 3.5–5.1)
Sodium: 138 mEq/L (ref 135–145)
Total Bilirubin: 0.9 mg/dL (ref 0.3–1.2)
Total Protein: 6.8 g/dL (ref 6.0–8.3)

## 2011-05-19 NOTE — ED Notes (Signed)
Pt from home with reports of joints locking up with pain that has worsened over the last 2 days as well as constipation for about 4 weeks. Pt with multiple complaints such as lightheadedness, nausea and abdominal pain off and on over the last month.

## 2011-05-19 NOTE — ED Provider Notes (Signed)
History    30yM with multiple complaints. "Last time I came in with a list." They wanted me to see a psychiatrist so I didn't do that again." "I believe everything is going on because there is something wrong inside of me." c/o "my joints keep locking up." Fingers, wrist and knees. Denies trauma. "My L arm will just go numb when I rest my elbow on something." "You can tell a lot by someone's eyes. My eyes seem a lot darker than everyone else's. I've been using eye drops but they don't look no better. I've been ready that that can be sign that there is something wrong with your liver." Pt denies abdominal pain. Intermittent constipation which has been ongoing issue. No blood in stool or melena. No urinary complaints. No fever or chills. Feel congested but attributes to allergies.  CSN: [id number]  Arrival date & time 05/19/11  9147   First MD Initiated Contact with Patient 05/19/11 [phone]      Chief Complaint  Patient presents with  . Joint Pain  . Constipation    (Consider location/radiation/quality/duration/timing/severity/associated sxs/prior treatment) HPI  Past Medical History  Diagnosis Date  . Sickle cell trait     History reviewed. No pertinent past surgical history.  History reviewed. No pertinent family history.  History  Substance Use Topics  . Smoking status: Former Games developer  . Smokeless tobacco: Never Used  . Alcohol Use: No      Review of Systems   Review of symptoms negative unless otherwise noted in HPI.   Allergies  Review of patient's allergies indicates no known allergies.  Home Medications   Current Outpatient Rx  Name Route Sig Dispense Refill  . CETIRIZINE HCL 10 MG PO TABS Oral Take 10 mg by mouth daily.    Marland Kitchen HYDROCORTISONE 1 % EX OINT Topical Apply 1 application topically 2 (two) times daily. On rash of right armpit      BP 124/66  Pulse 70  Temp(Src) 98.1 F (36.7 C) (Oral)  Resp 18  SpO2 100%  Physical Exam  Nursing note and vitals  reviewed. Constitutional: He appears well-developed and well-nourished. No distress.  HENT:  Head: Normocephalic and atraumatic.  Right Ear: External ear normal.  Left Ear: External ear normal.  Eyes: Conjunctivae are normal. Pupils are equal, round, and reactive to light. Right eye exhibits no discharge. Left eye exhibits no discharge.  Neck: Neck supple.  Cardiovascular: Normal rate, regular rhythm and normal heart sounds.  Exam reveals no gallop and no friction rub.   No murmur heard. Pulmonary/Chest: Effort normal and breath sounds normal. No respiratory distress.  Abdominal: Soft. He exhibits no distension. There is no tenderness.  Musculoskeletal: Normal range of motion. He exhibits no edema and no tenderness.       Extremities normal in appearance. No tenderness. No skin lesions. No effusions. FROM. Strength 5/5 b/l u/l ext.  Neurological: He is alert. No cranial nerve deficit. He exhibits normal muscle tone. Coordination normal.       Gait steady  Skin: Skin is warm and dry. No rash noted.  Psychiatric: He has a normal mood and affect. His behavior is normal. Thought content normal.    ED Course  Procedures (including critical care time)   Labs Reviewed  COMPREHENSIVE METABOLIC PANEL   No results found.   1. Nasal congestion   2. Anxiety       MDM  30yM with multiple complaints of varying chronicity and none of which appear to be urgent/emergent.  Afebrile and well appearing. Exam nonfocal. Pt concerned he is jaundiced among other things. To me he does not appear to be so. Basic labs ordered though and normal. Discussed at length with pt the importance of establishing a PCP. Pt is familiar with [facility] but he says he hasn't been to see them in about 2 years.         Raeford Razor, MD 05/19/11 1128

## 2011-05-19 NOTE — Discharge Instructions (Signed)
Anxiety and Panic Attacks Your caregiver has informed you that you are having an anxiety or panic attack. There may be many forms of this. Most of the time these attacks come suddenly and without warning. They come at any time of day, including periods of sleep, and at any time of life. They may be strong and unexplained. Although panic attacks are very scary, they are physically harmless. Sometimes the cause of your anxiety is not known. Anxiety is a protective mechanism of the body in its fight or flight mechanism. Most of these perceived danger situations are actually nonphysical situations (such as anxiety over losing a job). CAUSES  The causes of an anxiety or panic attack are many. Panic attacks may occur in otherwise healthy people given a certain set of circumstances. There may be a genetic cause for panic attacks. Some medications may also have anxiety as a side effect. SYMPTOMS  Some of the most common feelings are:  Intense terror.   Dizziness, feeling faint.   Hot and cold flashes.   Fear of going crazy.   Feelings that nothing is real.   Sweating.   Shaking.   Chest pain or a fast heartbeat (palpitations).   Smothering, choking sensations.   Feelings of impending doom and that death is near.   Tingling of extremities, this may be from over-breathing.   Altered reality (derealization).   Being detached from yourself (depersonalization).  Several symptoms can be present to make up anxiety or panic attacks. DIAGNOSIS  The evaluation by your caregiver will depend on the type of symptoms you are experiencing. The diagnosis of anxiety or panic attack is made when no physical illness can be determined to be a cause of the symptoms. TREATMENT  Treatment to prevent anxiety and panic attacks may include:  Avoidance of circumstances that cause anxiety.   Reassurance and relaxation.   Regular exercise.   Relaxation therapies, such as yoga.   Psychotherapy with a  psychiatrist or therapist.   Avoidance of caffeine, alcohol and illegal drugs.   Prescribed medication.  SEEK IMMEDIATE MEDICAL CARE IF:   You experience panic attack symptoms that are different than your usual symptoms.   You have any worsening or concerning symptoms.  Document Released: 12/25/2004 Document Revised: 12/14/2010 Document Reviewed: 04/28/2009 ExitCare Patient Information 2012 [employer]   RESOURCE GUIDE  Dental Problems  Patients with Medicaid: [facility]                     [facility] [address]                                           [address] Phone:  [phone]                                                  Phone:  [phone]  If unable to pay or uninsured, contact:  [facility] or [facility] to become qualified for the adult dental clinic.  Chronic Pain Problems Contact [facility]  [phone] Patients need to be referred by their primary care doctor.  Insufficient Money for Medicine Contact [employer]:  call "211" or [facility] 271-5999.    No Primary Care Doctor Call [facility]  832-8000 Other agencies that provide inexpensive medical care    [facility]  [phone]    [facility]  [phone]    [facility]  [phone]    [facility] Clinic  [phone]    [facility]  [phone]    [facility]  [phone]  Psychological Services [facility]  [phone] [facility]  [phone] [facility]   800 853-5163 (emergency services 641-4993)  Substance Abuse Resources [facility]  336-882-2125 [facility] 336-784-9470 [facility] 336-285-9073 [facility] 336-845-3988 Residential & Outpatient Substance Abuse Program  800-659-3381  Abuse/Neglect [place] Child Abuse Hotline (336) 641-3795 [place] Child Abuse Hotline 800-378-5315  (After Hours)  Emergency Shelter [facility] (336) 271-5985  [facility] (336) 275-9566 [facility] (704) 372-4663  MRSA Hotline #:   832-7006    [place] [facility]  [facility] [place]     [employer]                          [facility] [address] [place]                       [address]      [address]  [place]                                                [place]                            [place] Phone:  [phone]                                   Phone:  [phone]                 Phone:  [phone]  [facility] Phone:  [phone]  [place] Child Abuse Hotline (336) 342-1394 (336) 342-3537 (After Hours)   

## 2013-01-28 ENCOUNTER — Emergency Department (INDEPENDENT_AMBULATORY_CARE_PROVIDER_SITE_OTHER)
Admission: EM | Admit: 2013-01-28 | Discharge: 2013-01-28 | Disposition: A | Discharge status: Home / Self Care | Payer: Self-pay | Source: Home / Self Care | Attending: Family Medicine | Admitting: Family Medicine

## 2013-01-28 ENCOUNTER — Encounter (HOSPITAL_COMMUNITY): Payer: Self-pay | Admitting: Emergency Medicine

## 2013-01-28 DIAGNOSIS — L2089 Other atopic dermatitis: Secondary | ICD-10-CM

## 2013-01-28 DIAGNOSIS — L209 Atopic dermatitis, unspecified: Secondary | ICD-10-CM

## 2013-01-28 MED ORDER — CLOTRIMAZOLE-BETAMETHASONE 1-0.05 % EX CREA: 1.0000 "application " | TOPICAL_CREAM | Freq: Two times a day (BID) | CUTANEOUS | Status: AC

## 2013-01-28 NOTE — Discharge Instructions (Signed)
Please use medication as directed. Contact the [facility] to establish primary care and either of the dermatologist listed on your paperwork to follow up with if skin condition does not improve.

## 2013-01-28 NOTE — ED Provider Notes (Signed)
Medical screening examination/treatment/procedure(s) were performed by a resident physician or non-physician practitioner and as the supervising physician I was immediately available for consultation/collaboration.  Asanti Craigo, MD    Markee Matera S Flossie Wexler, MD 01/28/13 1312 

## 2013-01-28 NOTE — ED Notes (Signed)
Pt  Has  Symptoms  Of  A  Chronic  Rash     Over  Large  Parts  Of  Body            The  Rash  Itches           He  Has  Had          This      For  What  S=he  descibes  A s  sev  Years   -  No  agioedema   Sitting  Upright on the  Exam table  And  He  Appears  In no acute  Distress

## 2013-01-28 NOTE — ED Provider Notes (Signed)
CSN: [id number]     Arrival date & time 01/28/13  0809 History   First MD Initiated Contact with Patient 01/28/13 [phone]     Chief Complaint  Patient presents with  . Rash   (Consider location/radiation/quality/duration/timing/severity/associated sxs/prior Treatment) HPI Comments: Patient presents with a 3 week history of acute exacerbation of chronic rash that has been present intermittently for over 3 years. He states that he has been able to control condition, on occasion, with diet. Has no PCP or dermatologist.   Patient is a 33 y.o. male presenting with rash. The history is provided by the patient.  Rash   Past Medical History  Diagnosis Date  . Sickle cell trait    History reviewed. No pertinent past surgical history. History reviewed. No pertinent family history. History  Substance Use Topics  . Smoking status: Former Games developermoker  . Smokeless tobacco: Never Used  . Alcohol Use: No    Review of Systems  Constitutional: Negative.   HENT: Negative.   Eyes: Negative.   Respiratory: Negative.   Cardiovascular: Negative.   Gastrointestinal: Negative.   Endocrine: Negative.   Genitourinary: Negative.   Musculoskeletal: Negative.   Skin: Positive for rash.  Allergic/Immunologic: Negative.   Neurological: Negative.   Hematological: Negative.   Psychiatric/Behavioral: Negative.     Allergies  Review of patient's allergies indicates no known allergies.  Home Medications   Current Outpatient Rx  Name  Route  Sig  Dispense  Refill  . cetirizine (ZYRTEC) 10 MG tablet   Oral   Take 10 mg by mouth daily.         . clotrimazole-betamethasone (LOTRISONE) cream   Topical   Apply 1 application topically 2 (two) times daily. X 10 days   45 g   0   . hydrocortisone 1 % ointment   Topical   Apply 1 application topically 2 (two) times daily. On rash of right armpit          BP 97/63  Pulse 95  Temp(Src) 97.1 F (36.2 C) (Oral)  Resp 18  SpO2 98% Physical Exam   Nursing note and vitals reviewed. Constitutional: He is oriented to person, place, and time. He appears well-developed and well-nourished.  HENT:  Head: Normocephalic and atraumatic.  Right Ear: External ear normal.  Left Ear: External ear normal.  Nose: Nose normal.  Mouth/Throat: Oropharynx is clear and moist.  Eyes: Conjunctivae are normal. Right eye exhibits no discharge. Left eye exhibits no discharge. No scleral icterus.  Cardiovascular: Normal rate, regular rhythm, normal heart sounds and intact distal pulses.   Pulmonary/Chest: Effort normal and breath sounds normal.  Abdominal: Soft. Bowel sounds are normal. He exhibits no distension. There is no tenderness.  Musculoskeletal: Normal range of motion.  Lymphadenopathy:    He has no cervical adenopathy.  Neurological: He is alert and oriented to person, place, and time.  Skin: Skin is warm and dry. Rash noted.  Poorly defined lichenified hyperpigmented plaques with fine grey scale at posterior base of neck, lower back bilateral axilla, bilateral inner thighs  Psychiatric: He has a normal mood and affect. His behavior is normal.    ED Course  Procedures (including critical care time) Labs Review Labs Reviewed - No data to display Imaging Review No results found.  EKG Interpretation    Date/Time:    Ventricular Rate:    PR Interval:    QRS Duration:   QT Interval:    QTC Calculation:   R Axis:  Text Interpretation:              MDM  Exam most consistent with chronic recurrent atopic dermatitis. Will rx lotrisone for 10 day course and advise establishment with both PCP and dermatology. Resources for above provided to patient.     Jess Barters [place], [place] 01/28/13 1034

## 2013-11-29 IMAGING — CR DG ABDOMEN ACUTE W/ 1V CHEST
3 series · 3 of 3 positions shown · non-contrast
Comparison: None.

CLINICAL DATA: Abdominal pain, nausea, constipation.

ACUTE ABDOMEN SERIES (ABDOMEN 2 VIEW & CHEST 1 VIEW)

[w chest pa]
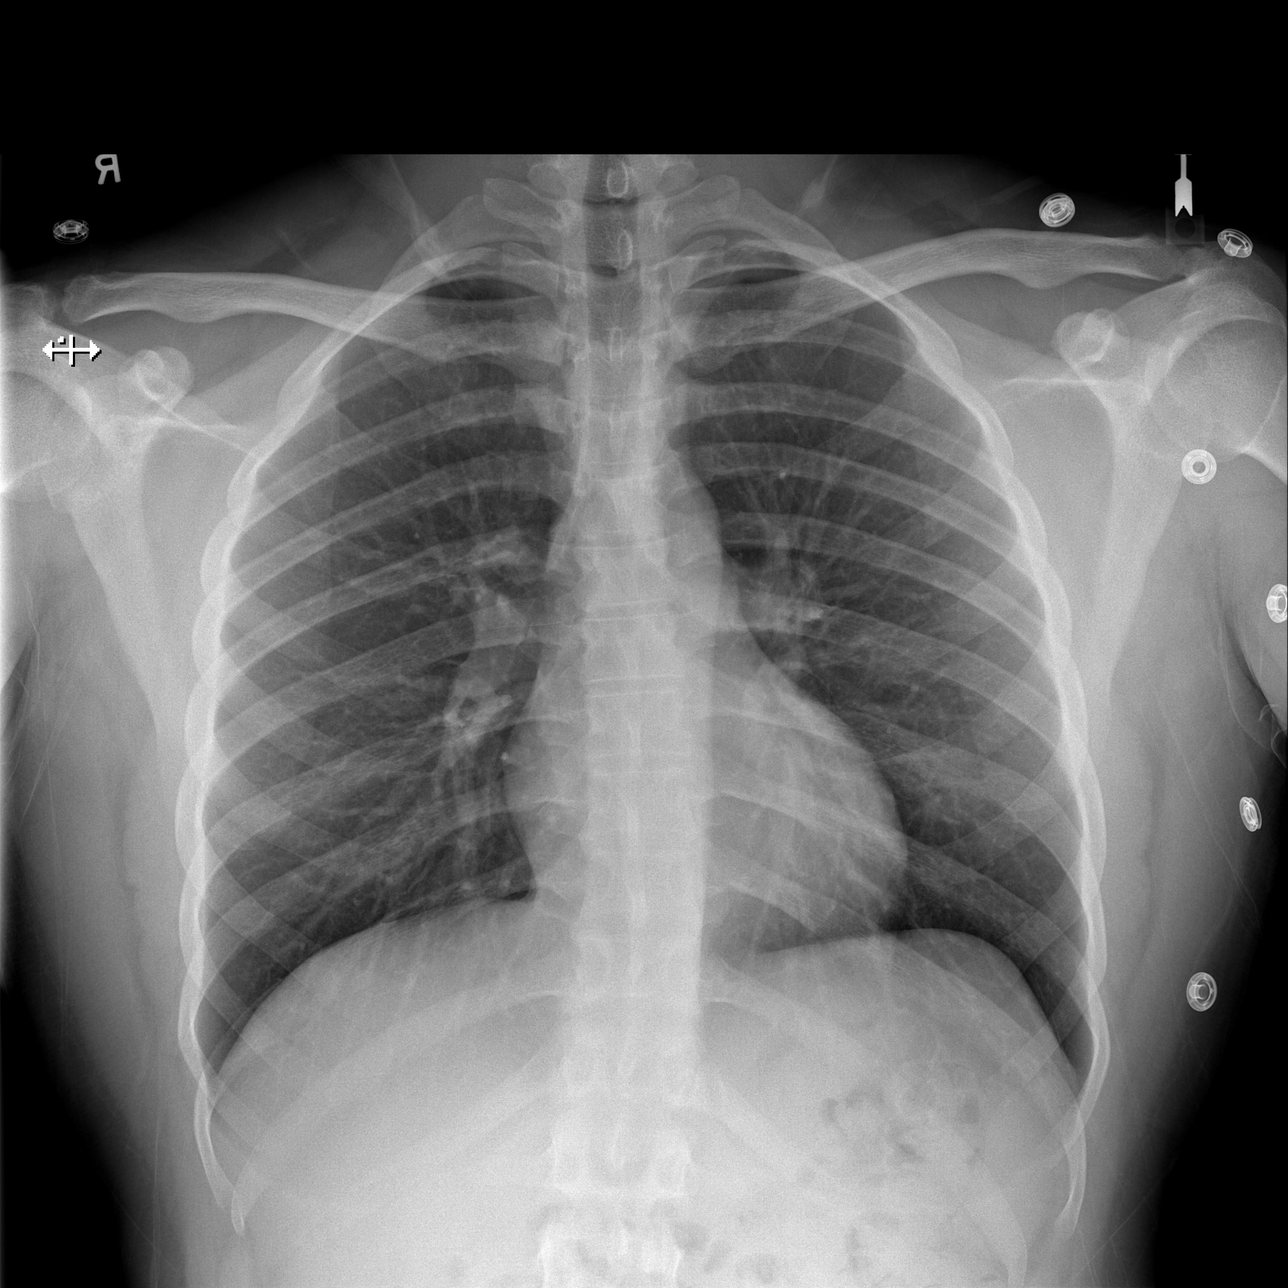

[w abdomen upright]
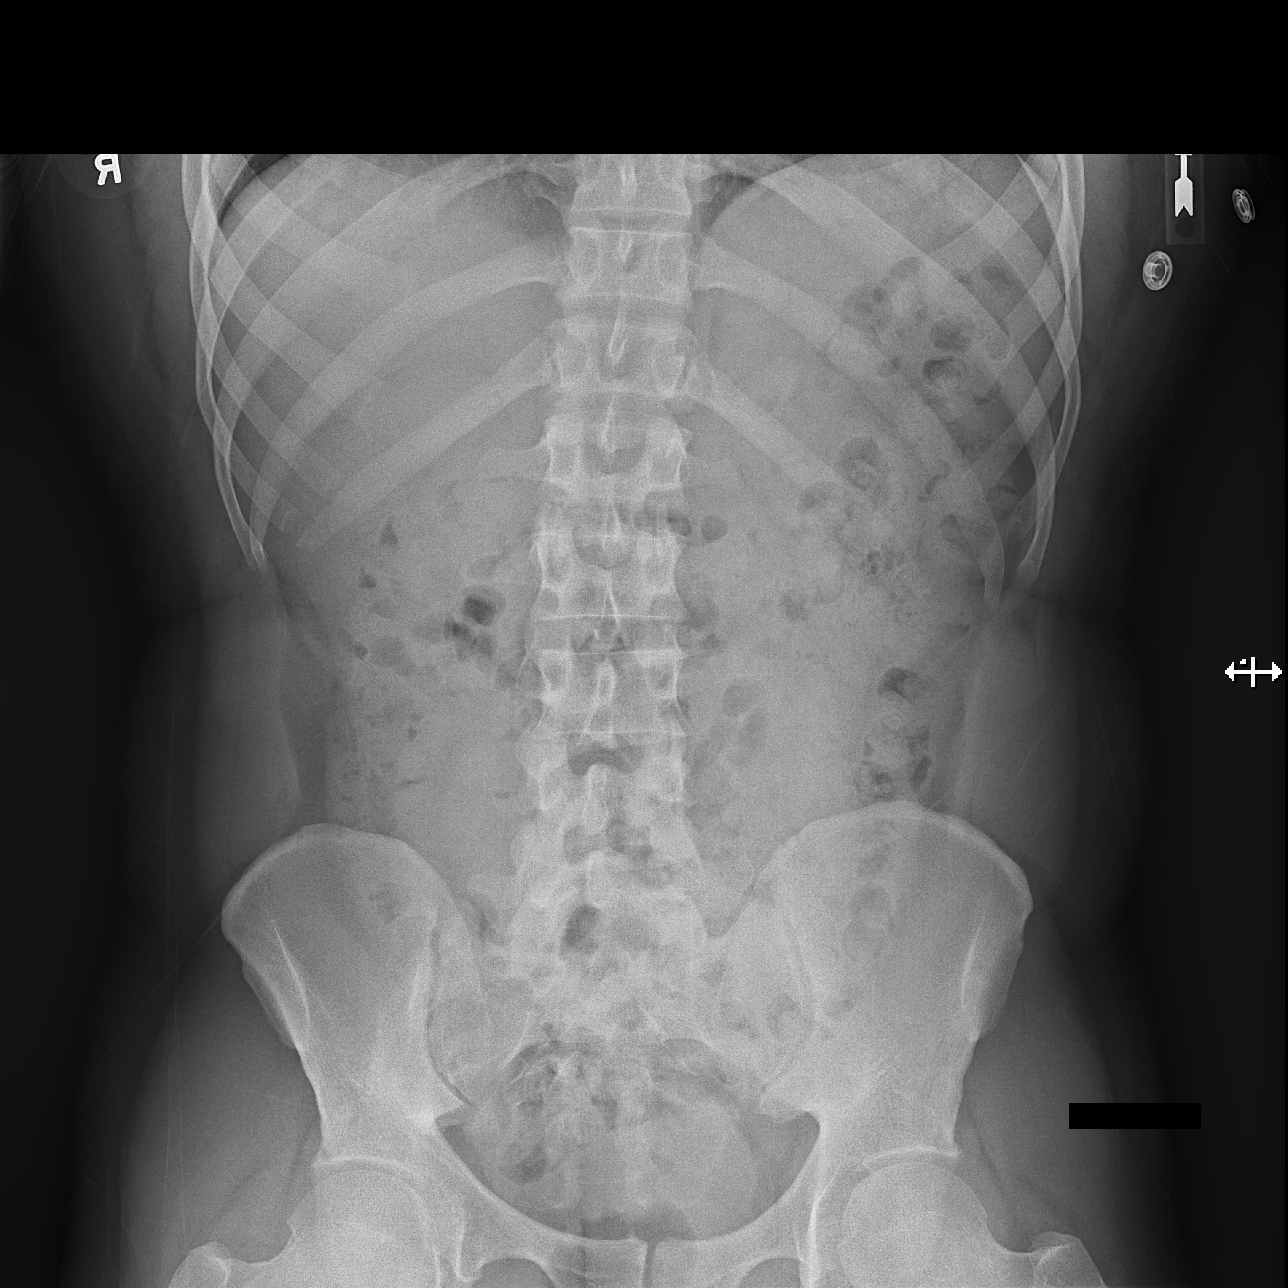

[t abdomen supine]
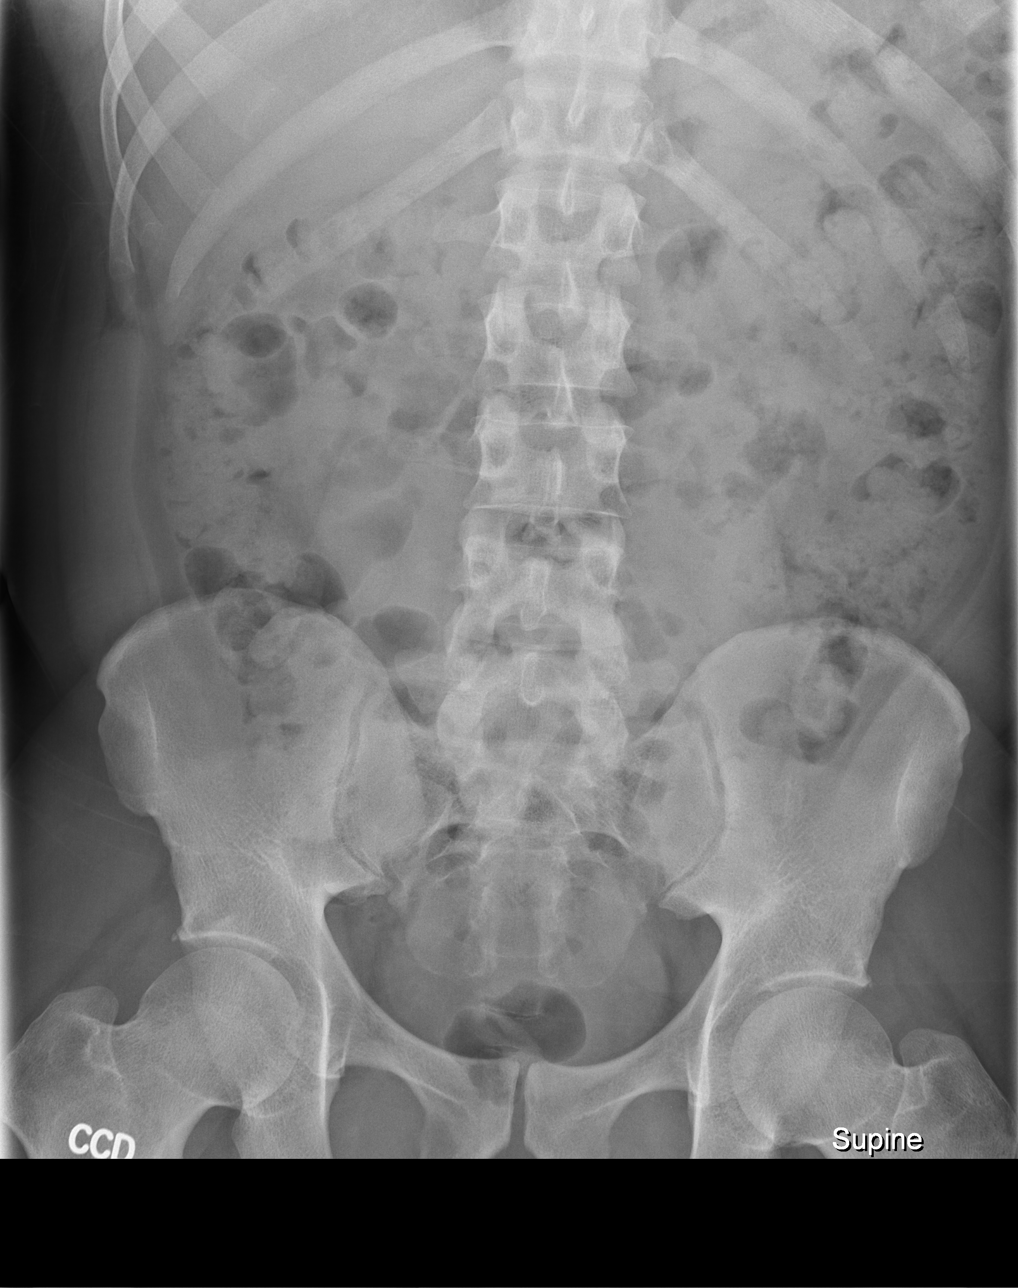

[3 of 3 positions shown; findings below may reference images not displayed]

FINDINGS: Large stool burden throughout the colon. The bowel gas
pattern is normal.  There is no evidence of free intraperitoneal
air.  No suspicious radio-opaque calculi or other significant
radiographic abnormality is seen. Heart size and mediastinal
contours are within normal limits.  Both lungs are clear.
IMPRESSION: No acute findings.  Large stool burden.
